# Patient Record
Sex: Male | Born: 1970 | Race: White | Hispanic: No | Marital: Married | Smoking: Current every day smoker
Health system: Southern US, Community
[De-identification: ages and names within clinical notes are randomized; demographics above are authoritative.]

## PROBLEM LIST (undated history)

## (undated) ENCOUNTER — Ambulatory Visit

## (undated) DIAGNOSIS — C6290 Malignant neoplasm of unspecified testis, unspecified whether descended or undescended: Secondary | ICD-10-CM

---

## 1989-01-11 HISTORY — PX: KNEE SURGERY: SHX244

## 1999-06-12 ENCOUNTER — Emergency Department (HOSPITAL_COMMUNITY): Admission: EM | Admit: 1999-06-12 | Discharge: 1999-06-12 | Payer: Self-pay

## 2006-01-17 ENCOUNTER — Emergency Department: Payer: Self-pay | Admitting: Emergency Medicine

## 2006-01-18 ENCOUNTER — Other Ambulatory Visit: Payer: Self-pay

## 2007-11-20 ENCOUNTER — Ambulatory Visit: Payer: Self-pay | Admitting: Internal Medicine

## 2008-01-12 DIAGNOSIS — C6290 Malignant neoplasm of unspecified testis, unspecified whether descended or undescended: Secondary | ICD-10-CM

## 2008-01-12 HISTORY — PX: ORCHIECTOMY: SHX2116

## 2008-01-12 HISTORY — DX: Malignant neoplasm of unspecified testis, unspecified whether descended or undescended: C62.90

## 2010-05-19 ENCOUNTER — Ambulatory Visit: Payer: Self-pay | Admitting: Internal Medicine

## 2010-06-03 ENCOUNTER — Ambulatory Visit: Payer: Self-pay | Admitting: Family Medicine

## 2011-05-27 ENCOUNTER — Ambulatory Visit: Payer: Self-pay | Admitting: Medical

## 2012-09-10 ENCOUNTER — Emergency Department: Payer: Self-pay | Admitting: Emergency Medicine

## 2013-03-18 ENCOUNTER — Ambulatory Visit: Payer: Self-pay | Admitting: Internal Medicine

## 2013-03-18 LAB — RAPID STREP-A WITH REFLX: Micro Text Report: NEGATIVE

## 2013-03-21 LAB — BETA STREP CULTURE(ARMC)

## 2013-04-11 ENCOUNTER — Ambulatory Visit: Payer: Self-pay | Admitting: Family Medicine

## 2013-04-11 LAB — CBC WITH DIFFERENTIAL/PLATELET
Basophil #: 0 10*3/uL (ref 0.0–0.1)
Basophil %: 0.6 %
Eosinophil #: 0 10*3/uL (ref 0.0–0.7)
Eosinophil %: 0.9 %
HCT: 52.2 % — ABNORMAL HIGH (ref 40.0–52.0)
HGB: 17.2 g/dL (ref 13.0–18.0)
Lymphocyte #: 1.2 10*3/uL (ref 1.0–3.6)
Lymphocyte %: 24.2 %
MCH: 28.3 pg (ref 26.0–34.0)
MCHC: 33 g/dL (ref 32.0–36.0)
MCV: 86 fL (ref 80–100)
Monocyte #: 0.6 x10 3/mm (ref 0.2–1.0)
Monocyte %: 11 %
Neutrophil #: 3.2 10*3/uL (ref 1.4–6.5)
Neutrophil %: 63.3 %
Platelet: 230 10*3/uL (ref 150–440)
RBC: 6.09 10*6/uL — ABNORMAL HIGH (ref 4.40–5.90)
RDW: 13.4 % (ref 11.5–14.5)
WBC: 5 10*3/uL (ref 3.8–10.6)

## 2013-04-11 LAB — RAPID INFLUENZA A&B ANTIGENS

## 2014-12-07 IMAGING — CR DG CHEST 2V
1 series · 2 of 2 positions shown · non-contrast
Comparison: None.

CLINICAL DATA: Cough, fever x3 weeks.  shielded

EXAM:
CHEST  2 VIEW

[Series 1: pa · 0.17mm/px · 2 of 2 slices shown]
[im 1/2]
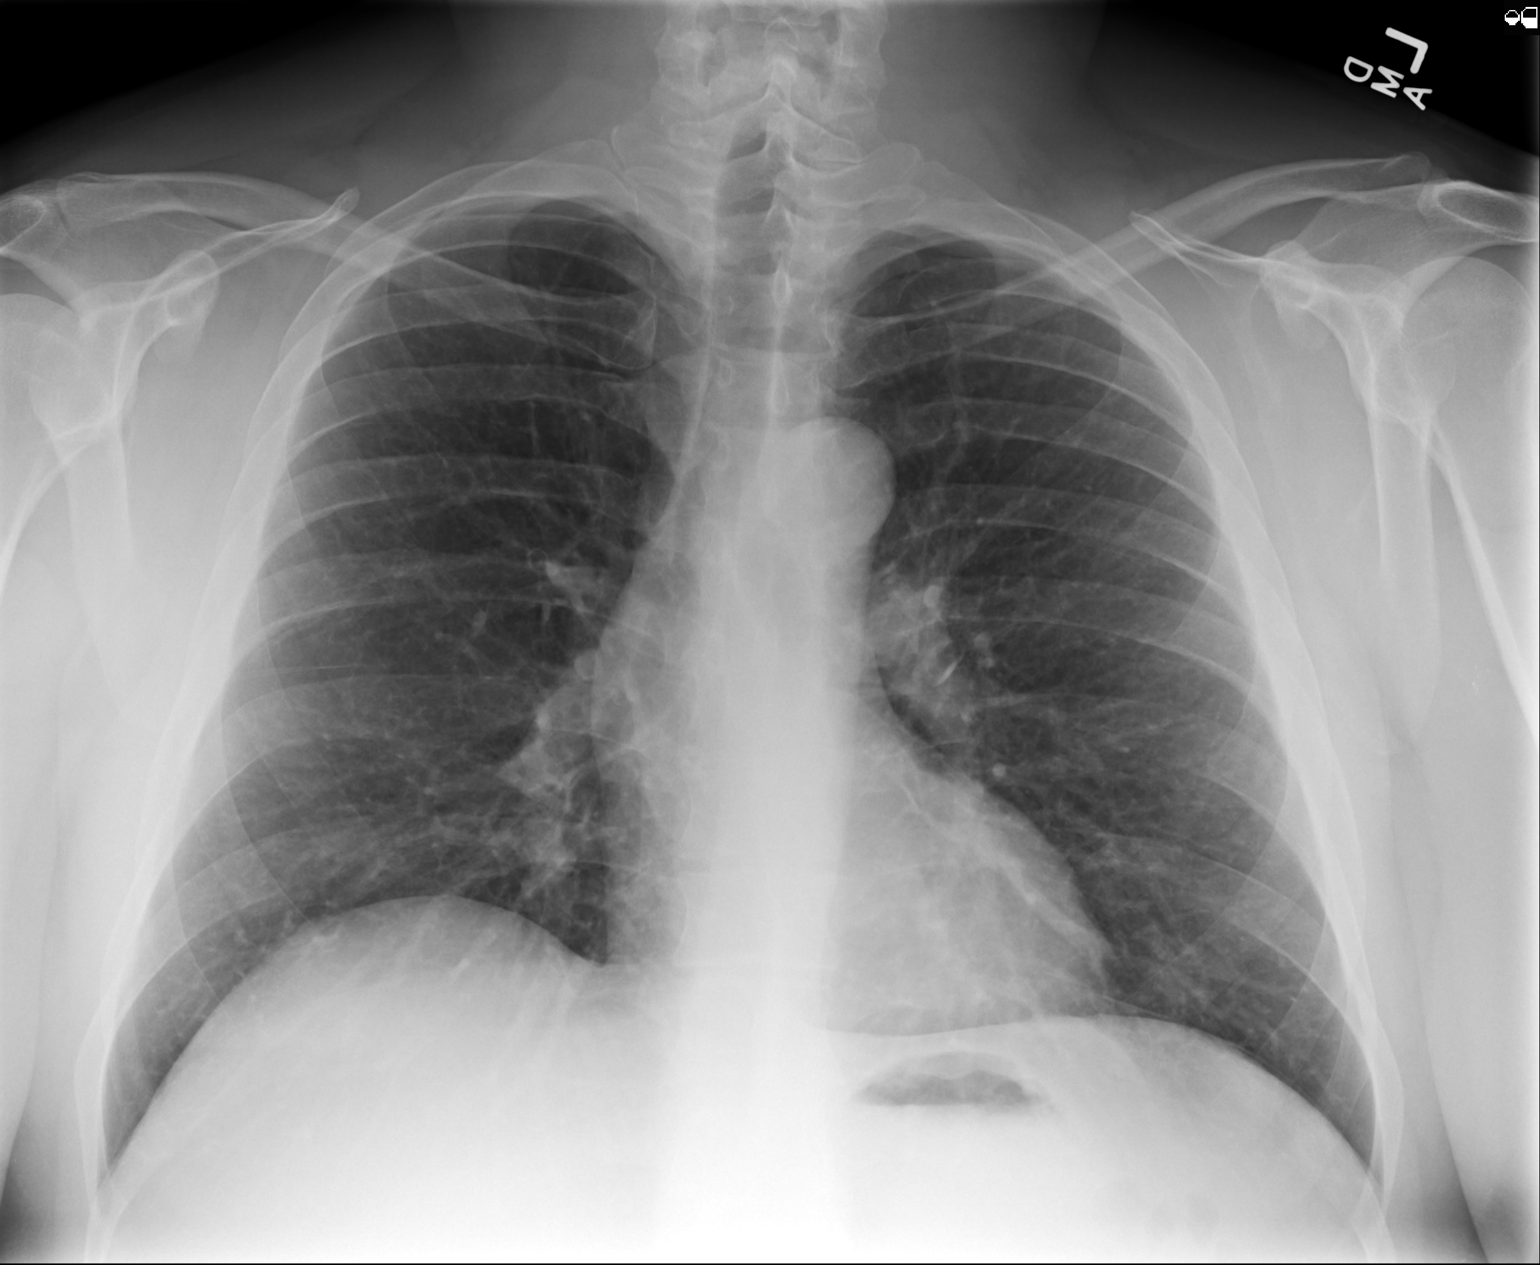
[im 2/2]
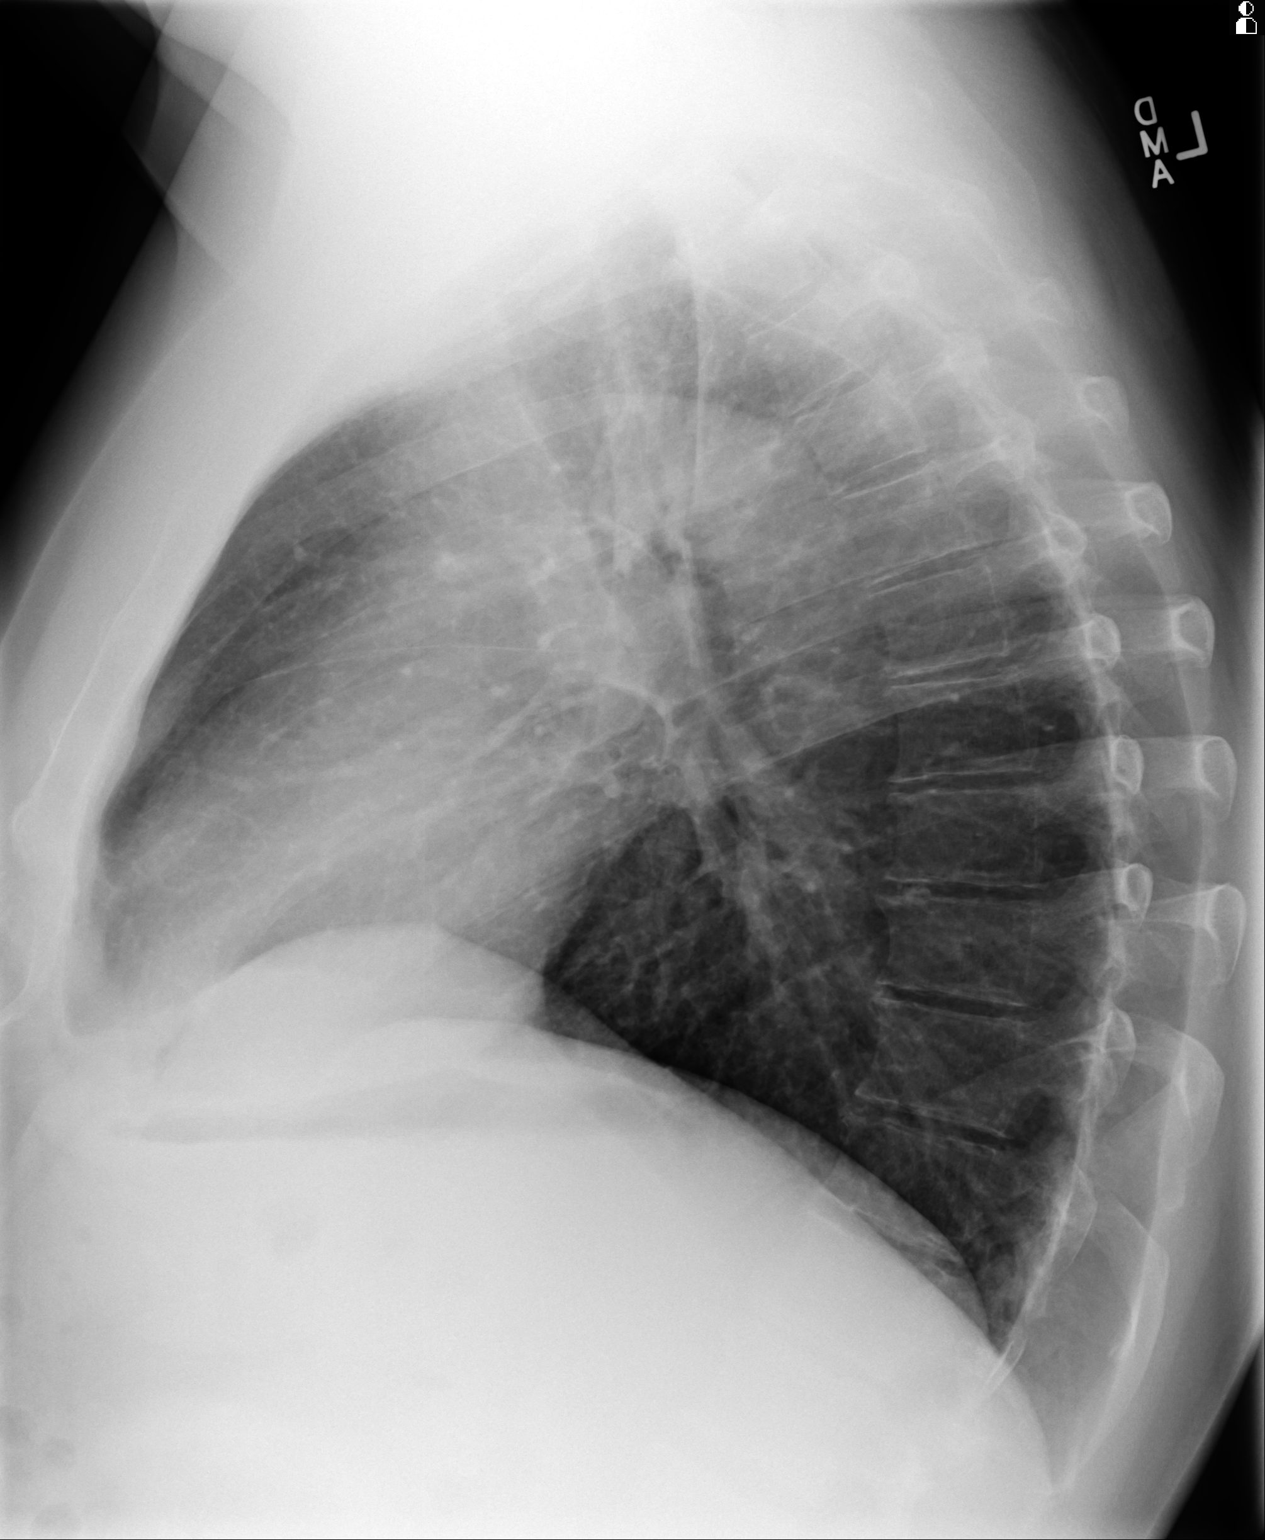

[2 of 2 positions shown; findings below may reference images not displayed]

FINDINGS: The heart size and mediastinal contours are within normal limits.
Both lungs are clear. The visualized skeletal structures are
unremarkable.
IMPRESSION: No active cardiopulmonary disease.

## 2014-12-12 ENCOUNTER — Ambulatory Visit
Admission: EM | Admit: 2014-12-12 | Discharge: 2014-12-12 | Disposition: A | Discharge status: Home / Self Care | Payer: Managed Care, Other (non HMO) | Attending: Emergency Medicine | Admitting: Emergency Medicine

## 2014-12-12 ENCOUNTER — Encounter: Payer: Self-pay | Admitting: Emergency Medicine

## 2014-12-12 DIAGNOSIS — R69 Illness, unspecified: Secondary | ICD-10-CM | POA: Diagnosis not present

## 2014-12-12 DIAGNOSIS — R6889 Other general symptoms and signs: Secondary | ICD-10-CM

## 2014-12-12 HISTORY — DX: Malignant neoplasm of unspecified testis, unspecified whether descended or undescended: C62.90

## 2014-12-12 LAB — URINALYSIS COMPLETE WITH MICROSCOPIC (ARMC ONLY)
Glucose, UA: NEGATIVE mg/dL
Leukocytes, UA: NEGATIVE
Nitrite: NEGATIVE
Specific Gravity, Urine: 1.02 (ref 1.005–1.030)
pH: 6 (ref 5.0–8.0)

## 2014-12-12 LAB — CBC WITH DIFFERENTIAL/PLATELET
BASOS ABS: 0.1 10*3/uL (ref 0–0.1)
Basophils Relative: 1 %
EOS PCT: 1 %
Eosinophils Absolute: 0.1 10*3/uL (ref 0–0.7)
HEMATOCRIT: 52.7 % — AB (ref 40.0–52.0)
HEMOGLOBIN: 17.6 g/dL (ref 13.0–18.0)
LYMPHS PCT: 18 %
Lymphs Abs: 1.7 10*3/uL (ref 1.0–3.6)
MCH: 27.8 pg (ref 26.0–34.0)
MCHC: 33.5 g/dL (ref 32.0–36.0)
MCV: 82.9 fL (ref 80.0–100.0)
Monocytes Absolute: 0.4 10*3/uL (ref 0.2–1.0)
Monocytes Relative: 5 %
NEUTROS ABS: 7.6 10*3/uL — AB (ref 1.4–6.5)
NEUTROS PCT: 77 %
PLATELETS: 342 10*3/uL (ref 150–440)
RBC: 6.35 MIL/uL — AB (ref 4.40–5.90)
RDW: 13.8 % (ref 11.5–14.5)
WBC: 9.9 10*3/uL (ref 3.8–10.6)

## 2014-12-12 LAB — RAPID INFLUENZA A&B ANTIGENS
Influenza A (ARMC): NOT DETECTED
Influenza B (ARMC): NOT DETECTED

## 2014-12-12 NOTE — Discharge Instructions (Signed)
Probiotics for diarrhea and this may take care of your complaints  Do not take antibiotics unless there is a good reason such as a bacterial infection  Have your blood pressure checked again in the near future to ensure white coat syndrome.   Limited tests performed today are all within very normal limits

## 2014-12-12 NOTE — ED Notes (Signed)
Patient c/o abdominal pain off and on for 10 days.  Patient denies N/V/D.

## 2014-12-12 NOTE — ED Provider Notes (Signed)
CSN: [id number]     Arrival date & time 12/12/14  1424 History   None    Chief Complaint  Patient presents with  . Abdominal Pain   (Consider location/radiation/quality/duration/timing/severity/associated sxs/prior Treatment) HPI History obtained from patient:  10 day history of ill defined symptoms including body aches, sensation of low grade temp, sore throat.   Past Medical History  Diagnosis Date  . Cancer ([place])   . Testicular cancer [facility])    History reviewed. No pertinent past surgical history. History reviewed. No pertinent family history. Social History  Substance Use Topics  . Smoking status: Former Research scientist (life sciences)  . Smokeless tobacco: None  . Alcohol Use: No    Review of Systems  Eyes: Negative.   Respiratory: Negative.   Cardiovascular: Negative.   Neurological: Negative.    +'ve for body aches, sore throat flu like symptoms  Allergies  Review of patient's allergies indicates no known allergies.  Home Medications   Prior to Admission medications   Medication Sig Start Date End Date Taking? Authorizing Provider  ramipril (ALTACE) 5 MG capsule Take 5 mg by mouth daily.   Yes Historical Provider, MD   Meds Ordered and Administered this Visit  Medications - No data to display  BP 151/113 mmHg  Pulse 94  Temp(Src) 97.7 F (36.5 C) (Tympanic)  Resp 16  Ht 5\' 11"  (1.803 m)  Wt 230 lb (104.327 kg)  BMI 32.09 kg/m2  SpO2 98% No data found.   Physical Exam  Constitutional: He is oriented to person, place, and time. He appears well-developed and well-nourished.  HENT:  Head: Normocephalic and atraumatic.  Mouth/Throat: Oropharynx is clear and moist.  Eyes: Conjunctivae are normal.  Cardiovascular: Normal rate and normal heart sounds.   Pulmonary/Chest: Effort normal and breath sounds normal.  Abdominal: Soft. Bowel sounds are normal. He exhibits no distension. There is no tenderness. There is no rebound and no guarding.  Musculoskeletal: Normal range of  motion.  Neurological: He is alert and oriented to person, place, and time.  Skin: Skin is warm and dry.  Psychiatric: He has a normal mood and affect. His behavior is normal. Thought content normal.  Nursing note and vitals reviewed.   ED Course  Procedures (including critical care time)  Labs Review Labs Reviewed  RAPID INFLUENZA A&B ANTIGENS ([facility] ONLY)  URINALYSIS COMPLETEWITH MICROSCOPIC ([facility] ONLY)  CBC WITH DIFFERENTIAL/PLATELET    Imaging Review No results found.   Visual Acuity Review  Right Eye Distance:   Left Eye Distance:   Bilateral Distance:    Right Eye Near:   Left Eye Near:    Bilateral Near:         MDM   1. Feeling unwell     Very vague, non specific complaints. Pt is reassured   No indication for antibx at this time.   Discussed lab work with patient. Reassured that he is ok  Pt does now admit that he took amoxil and symptoms of the gastric kind started after taking amoxil. I have advised him to use probiotics.   Konrad Felix, PA 12/12/14 1726

## 2015-07-23 ENCOUNTER — Ambulatory Visit
Admission: EM | Admit: 2015-07-23 | Discharge: 2015-07-23 | Disposition: A | Discharge status: Home / Self Care | Payer: Managed Care, Other (non HMO) | Attending: Family Medicine | Admitting: Family Medicine

## 2015-07-23 DIAGNOSIS — T148 Other injury of unspecified body region: Secondary | ICD-10-CM

## 2015-07-23 DIAGNOSIS — W57XXXA Bitten or stung by nonvenomous insect and other nonvenomous arthropods, initial encounter: Secondary | ICD-10-CM

## 2015-07-23 LAB — CBC WITH DIFFERENTIAL/PLATELET
Basophils Absolute: 0.1 10*3/uL (ref 0–0.1)
Basophils Relative: 1 %
Eosinophils Absolute: 0.1 10*3/uL (ref 0–0.7)
Eosinophils Relative: 1 %
HEMATOCRIT: 50.7 % (ref 40.0–52.0)
HEMOGLOBIN: 17.2 g/dL (ref 13.0–18.0)
LYMPHS ABS: 1.9 10*3/uL (ref 1.0–3.6)
LYMPHS PCT: 18 %
MCH: 27.9 pg (ref 26.0–34.0)
MCHC: 33.8 g/dL (ref 32.0–36.0)
MCV: 82.5 fL (ref 80.0–100.0)
Monocytes Absolute: 0.5 10*3/uL (ref 0.2–1.0)
Monocytes Relative: 5 %
NEUTROS PCT: 75 %
Neutro Abs: 7.8 10*3/uL — ABNORMAL HIGH (ref 1.4–6.5)
Platelets: 294 10*3/uL (ref 150–440)
RBC: 6.14 MIL/uL — AB (ref 4.40–5.90)
RDW: 13.9 % (ref 11.5–14.5)
WBC: 10.5 10*3/uL (ref 3.8–10.6)

## 2015-07-23 MED ORDER — DOXYCYCLINE HYCLATE 100 MG PO TABS: 100.0000 mg | ORAL_TABLET | Freq: Two times a day (BID) | ORAL | Status: DC

## 2015-07-23 NOTE — ED Provider Notes (Signed)
CSN: [id number]     Arrival date & time 07/23/15  1132 History   First MD Initiated Contact with Patient 07/23/15 1219     Chief Complaint  Patient presents with  . Generalized Body Aches   (Consider location/radiation/quality/duration/timing/severity/associated sxs/prior Treatment) HPI Comments: 45 yo male with a tick bite about 10 days ago now with c/o chills, bodyaches, joint pains, fatigue. Has also had some mild left lower quadrant abdominal pain for 5 days associated with mild diarrhea. States thinks tick was embedded for about 24hrs, was removed completely by patient and was not engorged.   The history is provided by the patient.    Past Medical History  Diagnosis Date  . Testicular cancer ([place]) 2010   Past Surgical History  Procedure Laterality Date  . Orchiectomy  2010  . Knee surgery Left 1991   Family History  Problem Relation Age of Onset  . Thyroid disease Mother    Social History  Substance Use Topics  . Smoking status: Current Every Day Smoker -- 0.50 packs/day    Types: Cigarettes  . Smokeless tobacco: None  . Alcohol Use: No    Review of Systems  Allergies  Review of patient's allergies indicates no known allergies.  Home Medications   Prior to Admission medications   Medication Sig Start Date End Date Taking? Authorizing Provider  ramipril (ALTACE) 5 MG capsule Take 5 mg by mouth daily.   Yes Historical Provider, MD  doxycycline (VIBRA-TABS) 100 MG tablet Take 1 tablet (100 mg total) by mouth 2 (two) times daily. 07/23/15   Norval Gable, MD   Meds Ordered and Administered this Visit  Medications - No data to display  BP 156/116 mmHg  Pulse 80  Temp(Src) 98.4 F (36.9 C) (Oral)  Resp 17  Ht 5\' 11"  (1.803 m)  Wt 230 lb (104.327 kg)  BMI 32.09 kg/m2  SpO2 99% No data found.   Physical Exam  Constitutional: He appears well-developed and well-nourished. No distress.  HENT:  Head: Normocephalic and atraumatic.  Nose: Nose normal.   Mouth/Throat: Uvula is midline, oropharynx is clear and moist and mucous membranes are normal.  Eyes: Conjunctivae and EOM are normal. Pupils are equal, round, and reactive to light. Right eye exhibits no discharge. Left eye exhibits no discharge. No scleral icterus.  Neck: Normal range of motion. Neck supple. No tracheal deviation present. No thyromegaly present.  Cardiovascular: Normal rate, regular rhythm and normal heart sounds.   Pulmonary/Chest: Effort normal and breath sounds normal. No stridor. No respiratory distress. He has no wheezes. He has no rales. He exhibits no tenderness.  Abdominal: Soft. Bowel sounds are normal. He exhibits no distension and no mass. There is no tenderness. There is no rebound and no guarding.  Lymphadenopathy:    He has no cervical adenopathy.  Neurological: He is alert.  Skin: Skin is warm and dry. No rash noted. He is not diaphoretic.  Nursing note and vitals reviewed.   ED Course  Procedures (including critical care time)  Labs Review Labs Reviewed  CBC WITH DIFFERENTIAL/PLATELET - Abnormal; Notable for the following:    RBC 6.14 (*)    Neutro Abs 7.8 (*)    All other components within normal limits  B. BURGDORFI ANTIBODIES  ROCKY MTN SPOTTED FVR ABS PNL(IGG+IGM)    Imaging Review No results found.   Visual Acuity Review  Right Eye Distance:   Left Eye Distance:   Bilateral Distance:    Right Eye Near:   Left Eye  Near:    Bilateral Near:         MDM   1. Tick bite    Discharge Medication List as of 07/23/2015 12:49 PM    START taking these medications   Details  doxycycline (VIBRA-TABS) 100 MG tablet Take 1 tablet (100 mg total) by mouth 2 (two) times daily., Starting 07/23/2015, Until Discontinued, Normal       1. diagnosis reviewed with patient 2. rx as per orders above; reviewed possible side effects, interactions, risks and benefits  3. Check lyme/RMSF titers 4. Follow-up prn if symptoms worsen or don't  improve    Norval Gable, MD 07/23/15 1504

## 2015-07-23 NOTE — ED Notes (Signed)
Patient complains of body aches, lethargic, chills, joint pain, abdominal pain. Patient states that he removed a tick off his right knee around 8 days ago. Patient states that symptoms started around 4 days ago and have been worsening.

## 2015-07-23 NOTE — Discharge Instructions (Signed)
Tick Bite Information Ticks are insects that attach themselves to the skin and draw blood for food. There are various types of ticks. Common types include wood ticks and deer ticks. Most ticks live in shrubs and grassy areas. Ticks can climb onto your body when you make contact with leaves or grass where the tick is waiting. The most common places on the body for ticks to attach themselves are the scalp, neck, armpits, waist, and groin. Most tick bites are harmless, but sometimes ticks carry germs that cause diseases. These germs can be spread to a person during the tick's feeding process. The chance of a disease spreading through a tick bite depends on:   The type of tick.  Time of year.   How long the tick is attached.   Geographic location.  HOW CAN YOU PREVENT TICK BITES? Take these steps to help prevent tick bites when you are outdoors:  Wear protective clothing. Long sleeves and long pants are best.   Wear white clothes so you can see ticks more easily.  Tuck your pant legs into your socks.   If walking on a trail, stay in the middle of the trail to avoid brushing against bushes.  Avoid walking through areas with long grass.  Put insect repellent on all exposed skin and along boot tops, pant legs, and sleeve cuffs.   Check clothing, hair, and skin repeatedly and before going inside.   Brush off any ticks that are not attached.  Take a shower or bath as soon as possible after being outdoors.  WHAT IS THE PROPER WAY TO REMOVE A TICK? Ticks should be removed as soon as possible to help prevent diseases caused by tick bites. 1. If latex gloves are available, put them on before trying to remove a tick.  2. Using fine-point tweezers, grasp the tick as close to the skin as possible. You may also use curved forceps or a tick removal tool. Grasp the tick as close to its head as possible. Avoid grasping the tick on its body. 3. Pull gently with steady upward pressure until  the tick lets go. Do not twist the tick or jerk it suddenly. This may break off the tick's head or mouth parts. 4. Do not squeeze or crush the tick's body. This could force disease-carrying fluids from the tick into your body.  5. After the tick is removed, wash the bite area and your hands with soap and water or other disinfectant such as alcohol. 6. Apply a small amount of antiseptic cream or ointment to the bite site.  7. Wash and disinfect any instruments that were used.  Do not try to remove a tick by applying a hot match, petroleum jelly, or fingernail polish to the tick. These methods do not work and may increase the chances of disease being spread from the tick bite.  WHEN SHOULD YOU SEEK MEDICAL CARE? Contact your health care provider if you are unable to remove a tick from your skin or if a part of the tick breaks off and is stuck in the skin.  After a tick bite, you need to be aware of signs and symptoms that could be related to diseases spread by ticks. Contact your health care provider if you develop any of the following in the days or weeks after the tick bite:  Unexplained fever.  Rash. A circular rash that appears days or weeks after the tick bite may indicate the possibility of Lyme disease. The rash may resemble   a target with a bull's-eye and may occur at a different part of your body than the tick bite.  Redness and swelling in the area of the tick bite.   Tender, swollen lymph glands.   Diarrhea.   Weight loss.   Cough.   Fatigue.   Muscle, joint, or bone pain.   Abdominal pain.   Headache.   Lethargy or a change in your level of consciousness.  Difficulty walking or moving your legs.   Numbness in the legs.   Paralysis.  Shortness of breath.   Confusion.   Repeated vomiting.    This information is not intended to replace advice given to you by your health care provider. Make sure you discuss any questions you have with your health  care provider.   Document Released: 12/26/1999 Document Revised: 01/18/2014 Document Reviewed: 06/07/2012 Elsevier Interactive Patient Education 2016 [employer] Inc.  

## 2015-07-25 LAB — B. BURGDORFI ANTIBODIES: B burgdorferi Ab IgG+IgM: <0.91 {ISR} (ref 0.00–0.90)

## 2015-07-25 LAB — ROCKY MTN SPOTTED FVR ABS PNL(IGG+IGM)
RMSF IgG: POSITIVE — AB
RMSF IgM: 0.25 index (ref 0.00–0.89)

## 2015-07-25 LAB — RMSF, IGG, IFA

## 2015-07-27 ENCOUNTER — Telehealth: Payer: Self-pay | Admitting: Emergency Medicine

## 2015-07-27 NOTE — Telephone Encounter (Signed)
Patient notified of his lab results.  Patient states that he is feeling better.  Patient was instructed to finish his Doxycycline and to follow-up with his PCP if his symptoms worsen.  Patient verbalized understanding.  The Communicable disease form was faxed to [facility].  I received confirmation that the fax went through.

## 2015-08-13 ENCOUNTER — Ambulatory Visit
Admission: EM | Admit: 2015-08-13 | Discharge: 2015-08-13 | Disposition: A | Discharge status: Home / Self Care | Payer: Managed Care, Other (non HMO) | Attending: Emergency Medicine | Admitting: Emergency Medicine

## 2015-08-13 ENCOUNTER — Encounter: Payer: Self-pay | Admitting: *Deleted

## 2015-08-13 DIAGNOSIS — R519 Headache, unspecified: Secondary | ICD-10-CM

## 2015-08-13 DIAGNOSIS — I159 Secondary hypertension, unspecified: Secondary | ICD-10-CM | POA: Diagnosis not present

## 2015-08-13 DIAGNOSIS — R51 Headache: Secondary | ICD-10-CM | POA: Diagnosis not present

## 2015-08-13 LAB — BASIC METABOLIC PANEL
Anion gap: 8 (ref 5–15)
BUN: 22 mg/dL — AB (ref 6–20)
CHLORIDE: 110 mmol/L (ref 101–111)
CO2: 23 mmol/L (ref 22–32)
CREATININE: 0.91 mg/dL (ref 0.61–1.24)
Calcium: 9.4 mg/dL (ref 8.9–10.3)
GFR calc non Af Amer: >60 mL/min (ref >60)
Glucose, Bld: 100 mg/dL — ABNORMAL HIGH (ref 65–99)
POTASSIUM: 3.6 mmol/L (ref 3.5–5.1)
Sodium: 141 mmol/L (ref 135–145)

## 2015-08-13 MED ORDER — DOXYCYCLINE HYCLATE 100 MG PO TABS
100.0000 mg | ORAL_TABLET | Freq: Two times a day (BID) | ORAL | 0 refills | Status: AC
Start: 2015-08-13 — End: 2015-08-20

## 2015-08-13 MED ORDER — RAMIPRIL 5 MG PO CAPS: 5.0000 mg | ORAL_CAPSULE | Freq: Every day | ORAL | 0 refills | Status: AC

## 2015-08-13 NOTE — Discharge Instructions (Signed)
Decrease your salt intake. diet and exercise will lower your blood pressure significantly. It is important to keep your blood pressure under good control, as having a elevated for prolonged periods of time significantly increases your risk of stroke, heart attacks, kidney damage, eye damage, and other problems. Return here in a week for blood pressure recheck if you're able to find a primary care physician by then. Return immediately to the ER if you start having chest pain, headache, problems seeing, problems talking, problems walking, if you feel like you're about to pass out, if you do pass out, if you have a seizure, or for any other concerns.Marland Kitchen

## 2015-08-13 NOTE — ED Triage Notes (Signed)
Patient was treated for RMSF on 07/23/15 and placed on antibiotics. Symptoms of headache fever and dizziness resolved while taking antibiotic. Patient completed antibiotic and symptoms are returning.

## 2015-08-13 NOTE — ED Provider Notes (Signed)
HPI  SUBJECTIVE:  Richard Guzman is a 45 y.o. male who presents with 2 days of gradual onset dull, left-sided headaches and lightheadedness that lasts seconds. this is present only when going from lying to standing. States that the headaches are intermittent, lasts hours, better with ibuprofen, with no aggravating factors. It is not associated with bending forward or lying down. He has not tried anything else for this. He denies nausea, vomiting, photophobia, neck stiffness, dysarthria, discoordination, arm or leg weakness, visual changes, nasal congestion, sinus pain or pressure, dental pain, ear pain. Headaches are not maximal at onset. They do not occur with exertion. He denies vertigo. He states that the lightheadedness occurs only when going from lying to standing. It is not associated with head turning, bending forward. He denies any change in his baseline tinnitus, hearing changes or ear fullness. He has not tried anything for this. He states that this headache is identical to the headache that he had on his last visit, when he was thought to have [facility] spotted fever/tickborne illness. He was treated with 10 days of doxycycline and states that everything improved, including his headaches, and he is requesting more antibiotics. States that headache is identical but not as intense as his previous headache was.  Currently Denies body aches, fatigue, joint pain, chills, states that these have resolved. No fevers or rash.  Patient was seen here on 7/12 for 10 days of chills, body aches, joint pains, fatigue, mild left lower quadrant pain with some diarrhea after removing a tick that was embedded for approximately 24 hours. Patient was treated with 10 days of doxycycline. RMSF and Lyme titers were sent. RMSF IgG was positive, IgM normal. Lyme disease was negative.  He has a past medical history of testicular cancer status post orchiectomy, "white coat hypertension", left-sided sinus headaches.  he has taken ramipril 5 mg daily in the past. PMD: None. In process of finding a new PMD.  Past Medical History:  Diagnosis Date  . Testicular cancer ([place]) 2010    Past Surgical History:  Procedure Laterality Date  . KNEE SURGERY Left 1991  . ORCHIECTOMY  2010    Family History  Problem Relation Age of Onset  . Thyroid disease Mother     Social History  Substance Use Topics  . Smoking status: Current Every Day Smoker    Packs/day: 0.50    Types: Cigarettes  . Smokeless tobacco: Never Used  . Alcohol use No    No current facility-administered medications for this encounter.   Current Outpatient Prescriptions:  .  doxycycline (VIBRA-TABS) 100 MG tablet, Take 1 tablet (100 mg total) by mouth 2 (two) times daily., Disp: 14 tablet, Rfl: 0 .  ramipril (ALTACE) 5 MG capsule, Take 1 capsule (5 mg total) by mouth daily., Disp: 30 capsule, Rfl: 0  No Known Allergies   ROS  As noted in HPI.   Physical Exam  BP (!) 164/120 (BP Location: Left Arm)   Pulse 88   Temp 98.2 F (36.8 C) (Oral)   Resp 18   Ht 5\' 11"  (1.803 m)   Wt 220 lb (99.8 kg)   SpO2 100%   BMI 30.68 kg/m    BP Readings from Last 3 Encounters:  08/13/15 (!) 164/120  07/23/15 (!) 156/116  12/12/14 (S) (!) 151/113     BP Readings from Last 3 Encounters:  08/13/15 (!) 164/120  07/23/15 (!) 156/116  12/12/14 (S) (!) 151/113   Constitutional: Well developed, well nourished, no  acute distress Eyes:  PERRLA, EOMI, conjunctiva normal bilaterally no photophobia.  HENT: Normocephalic, atraumatic,mucus membranes moist. Left TM partially obscured with cerumen, right TM normal. Minimal nasal congestion. No sinus tenderness. Normal dentition. Oropharynx normal.  Respiratory: Normal inspiratory effort lungs clear bilaterally Cardiovascular: Normal rate regular rhythm no murmurs rubs or gallops GI: nondistended skin: No rash, skin intact Musculoskeletal: no deformities Neurologic: Alert & oriented x 3,  cranial nerves II through XII intact, finger-nose, heel shin within normal limits. Romberg negative. Tandem gait steady. Speech fluent. Psychiatric: Speech and behavior appropriate   ED Course   Medications - No data to display  Orders Placed This Encounter  Procedures  . Basic metabolic panel    Standing Status:   Standing    Number of Occurrences:   1    Results for orders placed or performed during the hospital encounter of 08/13/15 (from the past 24 hour(s))  Basic metabolic panel     Status: Abnormal   Collection Time: 08/13/15  7:44 PM  Result Value Ref Range   Sodium 141 135 - 145 mmol/L   Potassium 3.6 3.5 - 5.1 mmol/L   Chloride 110 101 - 111 mmol/L   CO2 23 22 - 32 mmol/L   Glucose, Bld 100 (H) 65 - 99 mg/dL   BUN 22 (H) 6 - 20 mg/dL   Creatinine, Ser 0.91 0.61 - 1.24 mg/dL   Calcium 9.4 8.9 - 10.3 mg/dL   GFR calc non Af Amer >60 >60 mL/min   GFR calc Af Amer >60 >60 mL/min   Anion gap 8 5 - 15   No results found.  ED Clinical Impression  Nonintractable headache, unspecified chronicity pattern, unspecified headache type  Secondary hypertension, unspecified   ED Assessment/Plan  Previous records and labs reviewed. As noted in history of present illness   Discussed case with Dr. Megan Salon, infectious disease on-call. Since the patient had an adequate course of treatment with doxycycline, in the absence of other symptoms such as body aches, rash, joint pains, then we should consider other causes of his headache, however, he is not opposed to repeating a 5-7 day course of doxycycline. Discussed this with patient. he is amenable to this plan.  Pt hypertensive today. BP has been high on the last 3 visits the patient states that his "normal at home". Pt has no evidence of end organ damage. Creatinine normal. Pt denies any CNS type sx such as a different or worsening of HA that he has had before, visual changes, focal paresis, or new onset seizure activity. Pt denies  any CV sx such as CP, dyspnea, palpitations, pedal edema, tearing pain radiating to back or abd. Pt denied any renal sx such as anuria or hematuria. Pt denies illicit drug use, most notably cocaine, or recent use of OTC medications such as nasal decongestants. Discussed importance of lifestyle modifications as important first steps, and we'll restart him on the ramipril 5 mg daily which he was on previously. Advised patient to keep a log of his blood pressure, will provide a primary care referral, he will need to be seen by primary care physician of his choice in one week to have his blood pressure rechecked. Had Extensive discussion with patient the importance of keeping his blood pressure under control. He will bring in his home blood pressure measuring machine into his next doctor's visit to make sure that it is accurate. Giving patient strict hypertensive emergency ER return precautions.    Discussed labs,  MDM, plan  and followup with patient. Discussed sn/sx that should prompt return to the ED. Patient agrees with plan.   *This clinic note was created using Dragon dictation software. Therefore, there may be occasional mistakes despite careful proofreading.  ?   Melynda Ripple, MD 08/13/15 2021

## 2022-10-19 ENCOUNTER — Other Ambulatory Visit: Payer: Self-pay | Admitting: Emergency Medicine

## 2022-10-19 ENCOUNTER — Encounter: Payer: Self-pay | Admitting: Emergency Medicine

## 2022-10-19 DIAGNOSIS — R109 Unspecified abdominal pain: Secondary | ICD-10-CM

## 2022-10-19 DIAGNOSIS — N200 Calculus of kidney: Secondary | ICD-10-CM

## 2022-10-25 ENCOUNTER — Encounter: Payer: Self-pay | Admitting: Emergency Medicine

## 2022-10-26 ENCOUNTER — Encounter: Payer: Self-pay | Admitting: Emergency Medicine

## 2022-10-28 ENCOUNTER — Ambulatory Visit
Admission: RE | Admit: 2022-10-28 | Discharge: 2022-10-28 | Disposition: A | Discharge status: Home / Self Care | Payer: 59 | Source: Ambulatory Visit | Attending: Emergency Medicine | Admitting: Emergency Medicine

## 2022-10-28 DIAGNOSIS — R109 Unspecified abdominal pain: Secondary | ICD-10-CM

## 2022-10-28 MED ORDER — IOPAMIDOL (ISOVUE-300) INJECTION 61%
200.0000 mL | Freq: Once | INTRAVENOUS | Status: AC | PRN
  Administered 2022-10-28: 83 mL via INTRAVENOUS

## 2022-10-29 ENCOUNTER — Inpatient Hospital Stay: Admission: RE | Admit: 2022-10-29 | Payer: 59 | Source: Ambulatory Visit

## 2022-10-29 ENCOUNTER — Other Ambulatory Visit: Payer: Managed Care, Other (non HMO)

## 2023-02-16 ENCOUNTER — Other Ambulatory Visit: Payer: Self-pay | Admitting: Cardiology

## 2023-02-16 DIAGNOSIS — I2510 Atherosclerotic heart disease of native coronary artery without angina pectoris: Secondary | ICD-10-CM

## 2023-03-08 ENCOUNTER — Other Ambulatory Visit: Payer: Self-pay | Admitting: Orthopedic Surgery

## 2023-03-08 DIAGNOSIS — M542 Cervicalgia: Secondary | ICD-10-CM

## 2023-03-10 ENCOUNTER — Other Ambulatory Visit: Payer: Self-pay

## 2023-04-11 ENCOUNTER — Ambulatory Visit
Admission: RE | Admit: 2023-04-11 | Discharge: 2023-04-11 | Disposition: A | Discharge status: Home / Self Care | Payer: Self-pay | Source: Ambulatory Visit | Attending: Orthopedic Surgery | Admitting: Orthopedic Surgery

## 2023-04-11 DIAGNOSIS — M542 Cervicalgia: Secondary | ICD-10-CM

## 2023-04-15 ENCOUNTER — Other Ambulatory Visit: Payer: Self-pay

## 2023-04-16 ENCOUNTER — Ambulatory Visit
Admission: RE | Admit: 2023-04-16 | Discharge: 2023-04-16 | Disposition: A | Discharge status: Home / Self Care | Source: Ambulatory Visit | Attending: Orthopedic Surgery | Admitting: Orthopedic Surgery

## 2023-04-16 DIAGNOSIS — M542 Cervicalgia: Secondary | ICD-10-CM

## 2023-08-10 ENCOUNTER — Ambulatory Visit
Admission: EM | Admit: 2023-08-10 | Discharge: 2023-08-10 | Attending: Emergency Medicine | Admitting: Emergency Medicine

## 2023-08-10 DIAGNOSIS — S61214A Laceration without foreign body of right ring finger without damage to nail, initial encounter: Secondary | ICD-10-CM

## 2023-08-10 DIAGNOSIS — Z23 Encounter for immunization: Secondary | ICD-10-CM

## 2023-08-10 MED ORDER — TETANUS-DIPHTH-ACELL PERTUSSIS 5-2.5-18.5 LF-MCG/0.5 IM SUSY
0.5000 mL | PREFILLED_SYRINGE | Freq: Once | INTRAMUSCULAR | Status: AC
  Administered 2023-08-10: 0.5 mL via INTRAMUSCULAR

## 2023-08-10 NOTE — Discharge Instructions (Addendum)
 As we discussed, due to the nature of your injury and the continued arterial bleeding I do feel you should be evaluated in the emergency department.  Lease go to [facility] to be evaluated and treated for your laceration.

## 2023-08-10 NOTE — ED Notes (Signed)
 Patient is being discharged from the Urgent Care and sent to the Emergency Department via POV . Per Bernardino Ditch, NP , patient is in need of higher level of care due to finger laceration . Patient is aware and verbalizes understanding of plan of care.  Vitals:   08/10/23 1138  BP: (!) 163/112  Pulse: 79  Resp: 19  Temp: 98.4 F (36.9 C)  SpO2: 100%

## 2023-08-10 NOTE — ED Triage Notes (Signed)
 Patient states  that he cut his right ring finger about 3 hrs ago with a knife cutting plastic. Patient is unsure of last tdap. Patient states that the area is just sore. No numbness or tingling.

## 2023-08-10 NOTE — ED Provider Notes (Signed)
 [facility]    CSN: [id number] Arrival date & time: 08/10/23  1049      History   Chief Complaint Chief Complaint  Patient presents with   Laceration    HPI LORRY ANASTASI is a 53 y.o. male.   HPI  53 year old male with past medical history significant for testicular cancer status post orchiectomy presents for evaluation of a laceration to his right ring finger.  He reports that he cut him self with a knife approximately 3 hours ago.  He is unsure when his last Tdap was in place.  He denies any numbness or tingling.  Patient is actively arterial bleeding.  Past Medical History:  Diagnosis Date   Testicular cancer (HCC) 2010    There are no active problems to display for this patient.   Past Surgical History:  Procedure Laterality Date   KNEE SURGERY Left 1991   ORCHIECTOMY  2010       Home Medications    Prior to Admission medications   Medication Sig Start Date End Date Taking? Authorizing Provider  ramipril  (ALTACE ) 5 MG capsule Take 1 capsule (5 mg total) by mouth daily. 08/13/15   Van Knee, MD    Family History Family History  Problem Relation Age of Onset   Thyroid disease Mother     Social History Social History   Tobacco Use   Smoking status: Every Day    Current packs/day: 0.50    Types: Cigarettes   Smokeless tobacco: Never  Substance Use Topics   Alcohol use: No    Alcohol/week: 0.0 standard drinks of alcohol   Drug use: No     Allergies   Patient has no known allergies.   Review of Systems Review of Systems  Musculoskeletal:  Positive for arthralgias.  Skin:  Positive for wound.  Neurological:  Negative for weakness and numbness.     Physical Exam Triage Vital Signs ED Triage Vitals  Encounter Vitals Group     BP      Girls Systolic BP Percentile      Girls Diastolic BP Percentile      Boys Systolic BP Percentile      Boys Diastolic BP Percentile      Pulse      Resp      Temp      Temp src       SpO2      Weight      Height      Head Circumference      Peak Flow      Pain Score      Pain Loc      Pain Education      Exclude from Growth Chart    No data found.  Updated Vital Signs BP (!) 163/112 (BP Location: Left Arm)   Pulse 79   Temp 98.4 F (36.9 C) (Oral)   Resp 19   SpO2 100%   Visual Acuity Right Eye Distance:   Left Eye Distance:   Bilateral Distance:    Right Eye Near:   Left Eye Near:    Bilateral Near:     Physical Exam Vitals and nursing note reviewed.  Constitutional:      Appearance: Normal appearance. He is not ill-appearing.  HENT:     Head: Normocephalic and atraumatic.  Musculoskeletal:        General: Tenderness and signs of injury present.  Skin:    General: Skin is warm and dry.  Capillary Refill: Capillary refill takes less than 2 seconds.  Neurological:     General: No focal deficit present.     Mental Status: He is alert and oriented to person, place, and time.      UC Treatments / Results  Labs (all labs ordered are listed, but only abnormal results are displayed) Labs Reviewed - No data to display  EKG   Radiology No results found.  Procedures Procedures (including critical care time)  Medications Ordered in UC Medications  Tdap (BOOSTRIX) injection 0.5 mL (has no administration in time range)    Initial Impression / Assessment and Plan / UC Course  I have reviewed the triage vital signs and the nursing notes.  Pertinent labs & imaging results that were available during my care of the patient were reviewed by me and considered in my medical decision making (see chart for details).   Patient is a pleasant, nontoxic-appearing 53 year old male presenting for evaluation of right fourth finger laceration as outlined in HPI above.  As you can see in the image above, the laceration is just distal to the DIP joint on the volar aspect.  There is active arterial spurting from the wound.  The patient takes aspirin  daily and took a full-strength 1 yesterday.  No other blood thinners.  Due to the fact that the injury occurred 3 hours ago and he is having arterial bleeding I do feel he needs to be evaluated in the emergency department.  We will update his tetanus shot prior to discharge and I will have staff apply a pressure dressing.  Patient has elected to go to [facility]   Final Clinical Impressions(s) / UC Diagnoses   Final diagnoses:  Laceration of right ring finger without foreign body without damage to nail, initial encounter     Discharge Instructions      As we discussed, due to the nature of your injury and the continued arterial bleeding I do feel you should be evaluated in the emergency department.  Lease go to [facility] to be evaluated and treated for your laceration.     ED Prescriptions   None    PDMP not reviewed this encounter.   Bernardino Ditch, NP 08/10/23 [phone]
